# Patient Record
Sex: Male | Born: 1965 | Race: Black or African American | Hispanic: No | Marital: Married | Smoking: Current every day smoker
Health system: Southern US, Community
[De-identification: ages and names within clinical notes are randomized; demographics above are authoritative.]

## PROBLEM LIST (undated history)

## (undated) DIAGNOSIS — D869 Sarcoidosis, unspecified: Secondary | ICD-10-CM

## (undated) DIAGNOSIS — S39012A Strain of muscle, fascia and tendon of lower back, initial encounter: Secondary | ICD-10-CM

---

## 1998-02-01 ENCOUNTER — Encounter: Payer: Self-pay | Admitting: Emergency Medicine

## 1998-02-01 ENCOUNTER — Emergency Department (HOSPITAL_COMMUNITY): Admission: EM | Admit: 1998-02-01 | Discharge: 1998-02-01 | Payer: Self-pay | Admitting: *Deleted

## 1998-08-06 ENCOUNTER — Emergency Department (HOSPITAL_COMMUNITY): Admission: EM | Admit: 1998-08-06 | Discharge: 1998-08-06 | Payer: Self-pay | Admitting: Internal Medicine

## 1999-09-04 ENCOUNTER — Emergency Department (HOSPITAL_COMMUNITY): Admission: EM | Admit: 1999-09-04 | Discharge: 1999-09-04 | Payer: Self-pay | Admitting: Emergency Medicine

## 2006-10-24 ENCOUNTER — Emergency Department (HOSPITAL_COMMUNITY): Admission: EM | Admit: 2006-10-24 | Discharge: 2006-10-24 | Payer: Self-pay | Admitting: Emergency Medicine

## 2009-01-25 ENCOUNTER — Emergency Department (HOSPITAL_COMMUNITY): Admission: EM | Admit: 2009-01-25 | Discharge: 2009-01-25 | Payer: Self-pay | Admitting: Emergency Medicine

## 2011-04-16 ENCOUNTER — Emergency Department (HOSPITAL_COMMUNITY)
Admission: EM | Admit: 2011-04-16 | Discharge: 2011-04-16 | Disposition: A | Discharge status: Home / Self Care | Payer: Federal, State, Local not specified - PPO | Source: Home / Self Care | Attending: Emergency Medicine | Admitting: Emergency Medicine

## 2011-04-16 ENCOUNTER — Encounter (HOSPITAL_COMMUNITY): Payer: Self-pay | Admitting: Emergency Medicine

## 2011-04-16 DIAGNOSIS — S335XXA Sprain of ligaments of lumbar spine, initial encounter: Secondary | ICD-10-CM

## 2011-04-16 DIAGNOSIS — S39012A Strain of muscle, fascia and tendon of lower back, initial encounter: Secondary | ICD-10-CM

## 2011-04-16 HISTORY — DX: Strain of muscle, fascia and tendon of lower back, initial encounter: S39.012A

## 2011-04-16 HISTORY — DX: Sarcoidosis, unspecified: D86.9

## 2011-04-16 MED ORDER — MELOXICAM 15 MG PO TABS: 15.0000 mg | ORAL_TABLET | Freq: Every day | ORAL | Status: AC

## 2011-04-16 MED ORDER — METHOCARBAMOL 500 MG PO TABS: 500.0000 mg | ORAL_TABLET | Freq: Four times a day (QID) | ORAL | Status: AC

## 2011-04-16 MED ORDER — HYDROCODONE-ACETAMINOPHEN 5-325 MG PO TABS: 2.0000 | ORAL_TABLET | ORAL | Status: AC | PRN

## 2011-04-16 NOTE — ED Provider Notes (Signed)
History     CSN: [id number]  Arrival date & time 04/16/11  1024   First MD Initiated Contact with Patient 04/16/11 1053      Chief Complaint  Patient presents with  . Back Pain    (Consider location/radiation/quality/duration/timing/severity/associated sxs/prior treatment) HPI Comments: Patient reports right, nonradiating, achy lower thoracic back pain after helping his cousin move 4 days ago. Took 4 Aleve with temporary improvement, last dose several days ago. States this feels identical to previous time that he strained his back. No history of cancer, osteoporosis, prolonged steroid use, IVDU, fevers, bowel/bladder incontinence, saddle anesthesia.  Patient is a 46 y.o. male presenting with back pain. The history is provided by the patient. No language interpreter was used.  Back Pain  This is a new problem. The current episode started more than 2 days ago. The pain is associated with lifting heavy objects. The pain is present in the lumbar spine. The quality of the pain is described as aching. The pain does not radiate. The symptoms are aggravated by bending, twisting and certain positions. The pain is worse during the day. Pertinent negatives include no chest pain, no fever, no numbness, no abdominal pain, no abdominal swelling, no bowel incontinence, no perianal numbness, no bladder incontinence, no dysuria, no pelvic pain, no leg pain, no paresthesias, no paresis, no tingling and no weakness. He has tried NSAIDs for the symptoms. The treatment provided mild relief.    Past Medical History  Diagnosis Date  . Sarcoidosis   . Back strain     History reviewed. No pertinent past surgical history.  History reviewed. No pertinent family history.  History  Substance Use Topics  . Smoking status: Current Everyday Smoker -- 1.0 packs/day    Types: Cigarettes  . Smokeless tobacco: Not on file  . Alcohol Use: Yes     Occasional      Review of Systems  Constitutional: Negative for  fever.  Cardiovascular: Negative for chest pain.  Gastrointestinal: Negative for abdominal pain and bowel incontinence.  Genitourinary: Negative for bladder incontinence, dysuria and pelvic pain.  Musculoskeletal: Positive for back pain.  Neurological: Negative for tingling, weakness, numbness and paresthesias.    Allergies  Review of patient's allergies indicates no known allergies.  Home Medications   Current Outpatient Rx  Name Route Sig Dispense Refill  . HYDROCODONE-ACETAMINOPHEN 5-325 MG PO TABS Oral Take 2 tablets by mouth every 4 (four) hours as needed for pain. 20 tablet 0  . MELOXICAM 15 MG PO TABS Oral Take 1 tablet (15 mg total) by mouth daily. 14 tablet 0  . METHOCARBAMOL 500 MG PO TABS Oral Take 1 tablet (500 mg total) by mouth 4 (four) times daily. 40 tablet 0    BP 114/69  Pulse 78  Temp(Src) 98.8 F (37.1 C) (Oral)  Resp 16  SpO2 97%  Physical Exam  Nursing note and vitals reviewed. Constitutional: He is oriented to person, place, and time. He appears well-developed and well-nourished.  HENT:  Head: Normocephalic and atraumatic.  Eyes: Conjunctivae and EOM are normal.  Neck: Normal range of motion.  Cardiovascular: Normal rate.   Pulmonary/Chest: Effort normal. No respiratory distress.  Abdominal: He exhibits no distension.  Musculoskeletal: Normal range of motion.       Lumbar back: He exhibits no bony tenderness.       Back:       Bilateral lower extremities nontender, baseline ROM with intact  PT pulses. No pain with PROM hips bilaterally. SLR neg bilaterally.  Sensation baseline light touch bilaterally for Pt, DTR's symmetric and intact bilaterally KJ, Motor symmetric bilateral 5/5 hip flexion, quadriceps, hamstrings, EHL, foot dorsiflexion, foot plantarflexion, gait normal   Neurological: He is alert and oriented to person, place, and time.  Skin: Skin is warm and dry.  Psychiatric: He has a normal mood and affect. His behavior is normal.    ED  Course  Procedures (including critical care time)  Labs Reviewed - No data to display No results found.   1. Lumbar strain       MDM  H&P most consistent with right lumbar strain. No red flags on history or exam. Imaging not indicated at this time. Will send home with NSAIDs, muscle relaxants, Norco, gentle stretching exercises. Will refer patient to her primary care physician for routine health care.  Luiz Blare, MD 04/16/11 [phone]

## 2011-04-16 NOTE — ED Notes (Signed)
Pt was helping his cousin move on Saturday and felt a twinge in his back. When he has movement he has pain of 5-6 but does not have pain at all times. Pain has gotten slightly progressively worse but is not unbearable.

## 2011-04-16 NOTE — Discharge Instructions (Signed)
Take the medication as written. Take 1 gram of tylenol with the motrin up to 4 times a day as needed for pain and fever. This is an effective combination for pain. Take the hydrocodone/norco only for severe pain. Do not take the tylenol and hydrocodone/norco as they both have tylenol in them and too much can hurt your liver. Return if you get worse, have a  fever >100.4, or for any concerns.   Back Pain, Adult Low back pain is very common. About 1 in 5 people have back pain.The cause of low back pain is rarely dangerous. The pain often gets better over time.About half of people with a sudden onset of back pain feel better in just 2 weeks. About 8 in 10 people feel better by 6 weeks.  CAUSES Some common causes of back pain include:  Strain of the muscles or ligaments supporting the spine.   Wear and tear (degeneration) of the spinal discs.   Arthritis.   Direct injury to the back.  DIAGNOSIS Most of the time, the direct cause of low back pain is not known.However, back pain can be treated effectively even when the exact cause of the pain is unknown.Answering your caregiver's questions about your overall health and symptoms is one of the most accurate ways to make sure the cause of your pain is not dangerous. If your caregiver needs more information, he or she may order lab work or imaging tests (X-rays or MRIs).However, even if imaging tests show changes in your back, this usually does not require surgery. HOME CARE INSTRUCTIONS For many people, back pain returns.Since low back pain is rarely dangerous, it is often a condition that people can learn to [facility] their own.   Remain active. It is stressful on the back to sit or stand in one place. Do not sit, drive, or stand in one place for more than 30 minutes at a time. Take short walks on level surfaces as soon as pain allows.Try to increase the length of time you walk each day.   Do not stay in bed.Resting more than 1 or 2 days can  delay your recovery.   Do not avoid exercise or work.Your body is made to move.It is not dangerous to be active, even though your back may hurt.Your back will likely heal faster if you return to being active before your pain is gone.   Pay attention to your body when you bend and lift. Many people have less discomfortwhen lifting if they bend their knees, keep the load close to their bodies,and avoid twisting. Often, the most comfortable positions are those that put less stress on your recovering back.   Find a comfortable position to sleep. Use a firm mattress and lie on your side with your knees slightly bent. If you lie on your back, put a pillow under your knees.   Only take over-the-counter or prescription medicines as directed by your caregiver. Over-the-counter medicines to reduce pain and inflammation are often the most helpful.Your caregiver may prescribe muscle relaxant drugs.These medicines help dull your pain so you can more quickly return to your normal activities and healthy exercise.   Put ice on the injured area.   Put ice in a plastic bag.   Place a towel between your skin and the bag.   Leave the ice on for 15 to 20 minutes, 3 to 4 times a day for the first 2 to 3 days. After that, ice and heat may be alternated to reduce pain  and spasms.   Ask your caregiver about trying back exercises and gentle massage. This may be of some benefit.   Avoid feeling anxious or stressed.Stress increases muscle tension and can worsen back pain.It is important to recognize when you are anxious or stressed and learn ways to manage it.Exercise is a great option.  SEEK MEDICAL CARE IF:  You have pain that is not relieved with rest or medicine.   You have pain that does not improve in 1 week.   You have new symptoms.   You are generally not feeling well.  SEEK IMMEDIATE MEDICAL CARE IF:   You have pain that radiates from your back into your legs.   You develop new bowel or  bladder control problems.   You have unusual weakness or numbness in your arms or legs.   You develop nausea or vomiting.   You develop abdominal pain.   You feel faint.  Document Released: 02/17/2005 Document Revised: 10/30/2010 Document Reviewed: 07/08/2010 [facility] Patient Information 2012 [place], [place].

## 2012-03-17 ENCOUNTER — Emergency Department (HOSPITAL_COMMUNITY)
Admission: EM | Admit: 2012-03-17 | Discharge: 2012-03-17 | Disposition: A | Discharge status: Home / Self Care | Payer: Federal, State, Local not specified - PPO | Source: Home / Self Care

## 2012-03-17 ENCOUNTER — Encounter (HOSPITAL_COMMUNITY): Payer: Self-pay

## 2012-03-17 DIAGNOSIS — M7550 Bursitis of unspecified shoulder: Secondary | ICD-10-CM

## 2012-03-17 DIAGNOSIS — M67919 Unspecified disorder of synovium and tendon, unspecified shoulder: Secondary | ICD-10-CM

## 2012-03-17 DIAGNOSIS — M719 Bursopathy, unspecified: Secondary | ICD-10-CM

## 2012-03-17 MED ORDER — NAPROXEN 500 MG PO TBEC: 500.0000 mg | DELAYED_RELEASE_TABLET | Freq: Two times a day (BID) | ORAL | Status: AC

## 2012-03-17 MED ORDER — TRIAMCINOLONE ACETONIDE 40 MG/ML IJ SUSP
INTRAMUSCULAR | Status: AC
  Filled 2012-03-17: qty 5

## 2012-03-17 NOTE — ED Provider Notes (Signed)
History     CSN: [id number]  Arrival date & time 03/17/12  1230   None     Chief Complaint  Patient presents with  . 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 He states that in the past 3 days he said increased pain in his right shoulder. He points to the right deltoid into the right anterior shoulder. It is worse with certain movements and aches during the night. He denies any known trauma or repetitive injury.   Past Medical History  Diagnosis Date  . Sarcoidosis   . Back strain     History reviewed. No pertinent past surgical history.  History reviewed. No pertinent family history.  History  Substance Use Topics  . Smoking status: Current Every Day Smoker -- 1.0 packs/day    Types: Cigarettes  . Smokeless tobacco: Not on file  . Alcohol Use: Yes     Comment: Occasional      Review of Systems  All other systems reviewed and are negative.    Allergies  Review of patient's allergies indicates no known allergies.  Home Medications   Current Outpatient Rx  Name  Route  Sig  Dispense  Refill  . MELOXICAM 15 MG PO TABS   Oral   Take 1 tablet (15 mg total) by mouth daily.   14 tablet   0   . NAPROXEN 500 MG PO TBEC   Oral   Take 1 tablet (500 mg total) by mouth 2 (two) times daily with a meal.   20 tablet   0     BP 120/74  Pulse 75  Temp 98.1 F (36.7 C) (Oral)  Resp 16  SpO2 100%  Physical Exam  Constitutional: He is oriented to person, place, and time. He appears well-developed and well-nourished.  HENT:  Head: Normocephalic and atraumatic.  Eyes: EOM are normal. Left eye exhibits no discharge.  Neck: Normal range of motion. Neck supple.  Pulmonary/Chest: Effort normal.  Musculoskeletal: He exhibits tenderness. He exhibits  no edema.       Range of motion of the right shoulder is complete but associated with pain at extremes. He is able to abduct his arm or 150. No overlying swelling, edema or deformity. No bony tenderness. He points to the mid deltoid and anterior shoulder joint line at source of pain. Palpation reveals tenderness in both of these areas. Distal neurovascular motor sensory is intact.  Neurological: He is alert and oriented to person, place, and time. No cranial nerve deficit.  Skin: Skin is warm and dry.  Psychiatric: He has a normal mood and affect.    ED Course  Injection tendon or ligament Date/Time: 03/17/2012 2:59 PM Performed by: Phineas Real, Katalin Colledge Authorized by: Phineas Real, Lashina Milles Consent: Verbal consent obtained. Risks and benefits: risks, benefits and alternatives were discussed Consent given by: patient Patient understanding: patient states understanding of the procedure being performed Patient identity confirmed: verbally with patient Local anesthesia used: no Patient sedated: no Comments: 2 cc of Xylocaine 2% and 2 and half cc of Kenalog were injected in 2 separate sites within the right deltoid muscle and bursa.   (including critical care time)  Labs Reviewed - No data to display No results found.   1. Bursitis of deltoid   2. Tendinitis of right shoulder       MDM  Injection of 60  mg of Kenalog and 2 cc of Xylocaine 2% into the right deltoid bursa. Naprosyn EC 500 mg twice a day when necessary pain Apply heat to the area for comfort. Limit the use of your arm for the next several days. No heavy lifting or pulling. Oral your doctor if you continue to have shoulder pain. For any worsening new symptoms or problems may return         Hayden Rasmussen, NP 03/17/12 1503

## 2012-03-17 NOTE — ED Provider Notes (Signed)
Medical screening examination/treatment/procedure(s) were performed by non-physician practitioner and as supervising physician I was immediately available for consultation/collaboration.  Mordche Hedglin   Quinn Bartling, MD 03/17/12 1618 

## 2012-03-17 NOTE — ED Notes (Signed)
No known injury; state she ws told several yrs ago was told he had arthritis and bursitis in shoulder . Was okay last PM, but after work took shower, and has had pain in right shoulder ever since ; NAD

## 2012-08-03 ENCOUNTER — Emergency Department (HOSPITAL_COMMUNITY): Payer: Federal, State, Local not specified - PPO

## 2012-08-03 ENCOUNTER — Encounter (HOSPITAL_COMMUNITY): Payer: Self-pay | Admitting: Emergency Medicine

## 2012-08-03 ENCOUNTER — Emergency Department (HOSPITAL_COMMUNITY)
Admission: EM | Admit: 2012-08-03 | Discharge: 2012-08-03 | Disposition: A | Discharge status: Home / Self Care | Payer: Federal, State, Local not specified - PPO | Attending: Emergency Medicine | Admitting: Emergency Medicine

## 2012-08-03 DIAGNOSIS — M25522 Pain in left elbow: Secondary | ICD-10-CM

## 2012-08-03 DIAGNOSIS — Y9389 Activity, other specified: Secondary | ICD-10-CM | POA: Insufficient documentation

## 2012-08-03 DIAGNOSIS — F17200 Nicotine dependence, unspecified, uncomplicated: Secondary | ICD-10-CM | POA: Insufficient documentation

## 2012-08-03 DIAGNOSIS — S59909A Unspecified injury of unspecified elbow, initial encounter: Secondary | ICD-10-CM | POA: Insufficient documentation

## 2012-08-03 DIAGNOSIS — Y92410 Unspecified street and highway as the place of occurrence of the external cause: Secondary | ICD-10-CM | POA: Insufficient documentation

## 2012-08-03 DIAGNOSIS — M25512 Pain in left shoulder: Secondary | ICD-10-CM

## 2012-08-03 DIAGNOSIS — Z8739 Personal history of other diseases of the musculoskeletal system and connective tissue: Secondary | ICD-10-CM | POA: Insufficient documentation

## 2012-08-03 DIAGNOSIS — S6990XA Unspecified injury of unspecified wrist, hand and finger(s), initial encounter: Secondary | ICD-10-CM | POA: Insufficient documentation

## 2012-08-03 DIAGNOSIS — IMO0002 Reserved for concepts with insufficient information to code with codable children: Secondary | ICD-10-CM | POA: Insufficient documentation

## 2012-08-03 DIAGNOSIS — S46909A Unspecified injury of unspecified muscle, fascia and tendon at shoulder and upper arm level, unspecified arm, initial encounter: Secondary | ICD-10-CM | POA: Insufficient documentation

## 2012-08-03 DIAGNOSIS — S4980XA Other specified injuries of shoulder and upper arm, unspecified arm, initial encounter: Secondary | ICD-10-CM | POA: Insufficient documentation

## 2012-08-03 NOTE — ED Provider Notes (Signed)
Medical screening examination/treatment/procedure(s) were performed by non-physician practitioner and as supervising physician I was immediately available for consultation/collaboration.   Colvin Blatt J. Macdonald Rigor, MD 08/03/12 1432 
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
## 2014-08-06 ENCOUNTER — Emergency Department (INDEPENDENT_AMBULATORY_CARE_PROVIDER_SITE_OTHER)
Admission: EM | Admit: 2014-08-06 | Discharge: 2014-08-06 | Disposition: A | Discharge status: Home / Self Care | Payer: Federal, State, Local not specified - PPO | Source: Home / Self Care | Attending: Family Medicine | Admitting: Family Medicine

## 2014-08-06 ENCOUNTER — Encounter (HOSPITAL_COMMUNITY): Payer: Self-pay | Admitting: Emergency Medicine

## 2014-08-06 DIAGNOSIS — J40 Bronchitis, not specified as acute or chronic: Secondary | ICD-10-CM | POA: Diagnosis not present

## 2014-08-06 MED ORDER — PREDNISONE 10 MG PO TABS: 30.0000 mg | ORAL_TABLET | Freq: Every day | ORAL | Status: DC

## 2014-08-06 MED ORDER — IPRATROPIUM BROMIDE 0.06 % NA SOLN: 2.0000 | Freq: Four times a day (QID) | NASAL | Status: AC

## 2014-08-06 MED ORDER — PROMETHAZINE-CODEINE 6.25-10 MG/5ML PO SYRP: 5.0000 mL | ORAL_SOLUTION | Freq: Four times a day (QID) | ORAL | Status: AC | PRN

## 2014-08-06 NOTE — ED Notes (Signed)
C/o prod cough onset 3-4 days Sx include ST, prod cough, fatigue, congestion, sneezing Denies fevers, chills Alert, no signs of acute distress.

## 2014-08-06 NOTE — Discharge Instructions (Signed)
Thank you for coming in today. Call or go to the emergency room if you get worse, have trouble breathing, have chest pains, or palpitations.  Do not drive after taking the cough medicine.   Acute Bronchitis Bronchitis is inflammation of the airways that extend from the windpipe into the lungs (bronchi). The inflammation often causes mucus to develop. This leads to a cough, which is the most common symptom of bronchitis.  In acute bronchitis, the condition usually develops suddenly and goes away over time, usually in a couple weeks. Smoking, allergies, and asthma can make bronchitis worse. Repeated episodes of bronchitis may cause further lung problems.  CAUSES Acute bronchitis is most often caused by the same virus that causes a cold. The virus can spread from person to person (contagious) through coughing, sneezing, and touching contaminated objects. SIGNS AND SYMPTOMS   Cough.   Fever.   Coughing up mucus.   Body aches.   Chest congestion.   Chills.   Shortness of breath.   Sore throat.  DIAGNOSIS  Acute bronchitis is usually diagnosed through a physical exam. Your health care provider will also ask you questions about your medical history. Tests, such as chest X-rays, are sometimes done to rule out other conditions.  TREATMENT  Acute bronchitis usually goes away in a couple weeks. Oftentimes, no medical treatment is necessary. Medicines are sometimes given for relief of fever or cough. Antibiotic medicines are usually not needed but may be prescribed in certain situations. In some cases, an inhaler may be recommended to help reduce shortness of breath and control the cough. A cool mist vaporizer may also be used to help thin bronchial secretions and make it easier to clear the chest.  HOME CARE INSTRUCTIONS  Get plenty of rest.   Drink enough fluids to keep your urine clear or pale yellow (unless you have a medical condition that requires fluid restriction). Increasing  fluids may help thin your respiratory secretions (sputum) and reduce chest congestion, and it will prevent dehydration.   Take medicines only as directed by your health care provider.  If you were prescribed an antibiotic medicine, finish it all even if you start to feel better.  Avoid smoking and secondhand smoke. Exposure to cigarette smoke or irritating chemicals will make bronchitis worse. If you are a smoker, consider using nicotine gum or skin patches to help control withdrawal symptoms. Quitting smoking will help your lungs heal faster.   Reduce the chances of another bout of acute bronchitis by washing your hands frequently, avoiding people with cold symptoms, and trying not to touch your hands to your mouth, nose, or eyes.   Keep all follow-up visits as directed by your health care provider.  SEEK MEDICAL CARE IF: Your symptoms do not improve after 1 week of treatment.  SEEK IMMEDIATE MEDICAL CARE IF:  You develop an increased fever or chills.   You have chest pain.   You have severe shortness of breath.  You have bloody sputum.   You develop dehydration.  You faint or repeatedly feel like you are going to pass out.  You develop repeated vomiting.  You develop a severe headache. MAKE SURE YOU:   Understand these instructions.  Will watch your condition.  Will get help right away if you are not doing well or get worse. Document Released: 03/27/2004 Document Revised: 07/04/2013 Document Reviewed: 08/10/2012 [facility] Patient Information 2015 [place], [place]. This information is not intended to replace advice given to you by your health care provider. Make  sure you discuss any questions you have with your health care provider.

## 2014-08-06 NOTE — ED Provider Notes (Signed)
Eduardo Coffey is a 49 y.o. male who presents to Urgent Care today for cough. Patient has a productive cough present for the past 3 or 4 days. He notes worsening fatigue and nasal congestion. No fevers or chills shortness of breath chest tightness or wheezing. No chest pains or palpitations. The cough is productive and interferes with sleep. He is a daily smoker.   Past Medical History  Diagnosis Date  . Sarcoidosis   . Back strain    History reviewed. No pertinent past surgical history. History  Substance Use Topics  . Smoking status: Current Every Day Smoker -- 1.00 packs/day    Types: Cigarettes  . Smokeless tobacco: Not on file  . Alcohol Use: Yes     Comment: Occasional   ROS as above Medications: No current facility-administered medications for this encounter.   Current Outpatient Prescriptions  Medication Sig Dispense Refill  . ipratropium (ATROVENT) 0.06 % nasal spray Place 2 sprays into both nostrils 4 (four) times daily. 15 mL 1  . naproxen (EC-NAPROSYN) 500 MG EC tablet Take 1 tablet (500 mg total) by mouth 2 (two) times daily with a meal. 20 tablet 0  . predniSONE (DELTASONE) 10 MG tablet Take 3 tablets (30 mg total) by mouth daily. 15 tablet 0  . promethazine-codeine (PHENERGAN WITH CODEINE) 6.25-10 MG/5ML syrup Take 5 mLs by mouth every 6 (six) hours as needed for cough. 120 mL 0   No Known Allergies   Exam:  BP 122/80 mmHg  Pulse 72  Temp(Src) 98.2 F (36.8 C) (Oral)  Resp 18  SpO2 99% Gen: Well NAD HEENT: EOMI,  MMM posterior pharynx with cobblestoning. Normal tympanic membranes bilaterally Lungs: Normal work of breathing. Coarse breath sounds bilaterally Heart: RRR no MRG Abd: NABS, Soft. Nondistended, Nontender Exts: Brisk capillary refill, warm and well perfused.   No results found for this or any previous visit (from the past 24 hour(s)). No results found.  Assessment and Plan: 49 y.o. male with bronchitis. Treat with prednisone, Atrovent nasal spray,  and codeine cough syrup. Follow-up as needed.  Discussed warning signs or symptoms. Please see discharge instructions. Patient expresses understanding.     Rodolph BongEvan S Giavana Rooke, MD 08/06/14 1435

## 2016-04-17 ENCOUNTER — Encounter (HOSPITAL_COMMUNITY): Payer: Self-pay | Admitting: Emergency Medicine

## 2016-04-17 ENCOUNTER — Emergency Department (HOSPITAL_COMMUNITY)
Admission: EM | Admit: 2016-04-17 | Discharge: 2016-04-17 | Disposition: A | Discharge status: Home / Self Care | Payer: Federal, State, Local not specified - PPO | Attending: Emergency Medicine | Admitting: Emergency Medicine

## 2016-04-17 DIAGNOSIS — Z79899 Other long term (current) drug therapy: Secondary | ICD-10-CM | POA: Insufficient documentation

## 2016-04-17 DIAGNOSIS — R05 Cough: Secondary | ICD-10-CM | POA: Diagnosis not present

## 2016-04-17 DIAGNOSIS — F17210 Nicotine dependence, cigarettes, uncomplicated: Secondary | ICD-10-CM | POA: Insufficient documentation

## 2016-04-17 DIAGNOSIS — R6889 Other general symptoms and signs: Secondary | ICD-10-CM

## 2016-04-17 MED ORDER — BENZONATATE 100 MG PO CAPS: 200.0000 mg | ORAL_CAPSULE | Freq: Two times a day (BID) | ORAL | 0 refills | Status: DC | PRN

## 2016-04-17 MED ORDER — OXYMETAZOLINE HCL 0.05 % NA SOLN: 1.0000 | Freq: Two times a day (BID) | NASAL | 0 refills | Status: AC

## 2016-04-17 NOTE — Discharge Instructions (Signed)
Take your medications as prescribed. I also recommend taking Tylenol and ibuprofen as prescribed over-the-counter, alternating between doses every 3-4 hours. Continue drinking fluids at home to remain hydrated. I recommend eating a bland diet for the next few days and taper symptoms have improved. °Follow-up with your primary care provider in the next 3-4 days if symptoms have not improved. °Return to the emergency department if symptoms worsen or new onset of headache, neck stiffness, difficulty breathing, coughing up blood, chest pain, abdominal pain, vomiting, unable to keep fluids down.  °

## 2016-04-17 NOTE — ED Triage Notes (Signed)
Pt from home with hot and cold chills, decreased appetite, cough, and body aches since Monday. Pt denies N/V/D.  NAD, A&O.

## 2016-04-17 NOTE — ED Provider Notes (Signed)
[facility]EMERGENCY DEPT Provider Note   CSN: [id number] Arrival date & time: 04/17/16  [id number]  By signing my name below, I, Eduardo Coffey, attest that this documentation has been prepared under the direction and in the presence of Eduardo HakeNicole Coffey, [place] . Electronically Signed: Sonum Coffey, Neurosurgeoncribe. 04/17/16. 10:45 AM.  History   Chief Complaint Chief Complaint  Patient presents with  . Flu Like Symptoms    The history is provided by the patient. No language interpreter was used.    HPI Comments: Eduardo Coffey is a 51 y.o. male who presents to the Emergency Department complaining of a persistent, gradually worsened cough that began 4 days ago. He reports associated chills, generalized myalgias, rhinorrhea, decreased appetite, and sore throat. He notes chest soreness and back soreness that is present with coughing spells. He has tried Robitussin and Delsym with minimal relief. He denies fever, HA, SOB, wheezing, hemoptysis, CP, abdominal pain, nausea, vomiting, diarrhea, rash, urinary symptoms, rash. Denies any known sick contacts.  Past Medical History:  Diagnosis Date  . Back strain   . Sarcoidosis (HCC)     There are no active problems to display for this patient.   History reviewed. No pertinent surgical history.     Home Medications    Prior to Admission medications   Medication Sig Start Date End Date Taking? Authorizing Provider  benzonatate (TESSALON) 100 MG capsule Take 2 capsules (200 mg total) by mouth 2 (two) times daily as needed for cough. 04/17/16   Barrett HenleNicole Elizabeth Nadeau, PA-C  ipratropium (ATROVENT) 0.06 % nasal spray Place 2 sprays into both nostrils 4 (four) times daily. 08/06/14   Rodolph BongEvan S Corey, MD  naproxen (EC-NAPROSYN) 500 MG EC tablet Take 1 tablet (500 mg total) by mouth 2 (two) times daily with a meal. 03/17/12   Hayden Rasmussenavid Mabe, NP  oxymetazoline (AFRIN NASAL SPRAY) 0.05 % nasal spray Place 1 spray into both nostrils 2 (two) times daily. Spray once into each nostril twice  daily for up to the next 3 days. Do not use for more than 3 days to prevent rebound rhinorrhea. 04/17/16   Barrett HenleNicole Elizabeth Nadeau, PA-C  predniSONE (DELTASONE) 10 MG tablet Take 3 tablets (30 mg total) by mouth daily. 08/06/14   Rodolph BongEvan S Corey, MD  promethazine-codeine (PHENERGAN WITH CODEINE) 6.25-10 MG/5ML syrup Take 5 mLs by mouth every 6 (six) hours as needed for cough. 08/06/14   Rodolph BongEvan S Corey, MD    Family History History reviewed. No pertinent family history.  Social History Social History  Substance Use Topics  . Smoking status: Current Every Day Smoker    Packs/day: 1.00    Types: Cigarettes  . Smokeless tobacco: Never Used  . Alcohol use 14.4 oz/week    24 Cans of beer per week     Allergies   Patient has no known allergies.   Review of Systems Review of Systems  Constitutional: Positive for appetite change and chills.  HENT: Positive for rhinorrhea and sore throat.   Respiratory: Positive for cough. Negative for shortness of breath and wheezing.   Cardiovascular: Negative for chest pain.  Gastrointestinal: Negative for abdominal pain, diarrhea, nausea and vomiting.  Genitourinary: Negative for difficulty urinating, dysuria, frequency and hematuria.  Musculoskeletal: Positive for myalgias.  Skin: Negative for rash.     Physical Exam Updated Vital Signs BP 104/77 (BP Location: Right Arm)   Pulse 88   Temp 99.5 F (37.5 C) (Oral)   Resp 18   Ht 6' (1.829 m)  Wt 83.9 kg   SpO2 98%   BMI 25.09 kg/m   Physical Exam  Constitutional: He is oriented to person, place, and time. He appears well-developed and well-nourished.  HENT:  Head: Normocephalic and atraumatic.  Right Ear: Tympanic membrane normal.  Left Ear: Tympanic membrane normal.  Nose: Rhinorrhea present. Right sinus exhibits no maxillary sinus tenderness and no frontal sinus tenderness. Left sinus exhibits no maxillary sinus tenderness and no frontal sinus tenderness.  Mouth/Throat: Uvula is midline,  oropharynx is clear and moist and mucous membranes are normal. No oropharyngeal exudate, posterior oropharyngeal edema, posterior oropharyngeal erythema or tonsillar abscesses. No tonsillar exudate.  Eyes: Conjunctivae and EOM are normal. Pupils are equal, round, and reactive to light. Right eye exhibits no discharge. Left eye exhibits no discharge. No scleral icterus.  Neck: Normal range of motion. Neck supple.  Cardiovascular: Normal rate, regular rhythm, normal heart sounds and intact distal pulses.   Pulmonary/Chest: Effort normal and breath sounds normal. No respiratory distress. He has no wheezes. He has no rales. He exhibits no tenderness.  Abdominal: Soft. Bowel sounds are normal. He exhibits no distension and no mass. There is no tenderness. There is no rebound and no guarding. No hernia.  Musculoskeletal: Normal range of motion. He exhibits no edema.  Lymphadenopathy:    He has no cervical adenopathy.  Neurological: He is alert and oriented to person, place, and time.  Skin: Skin is warm and dry.  Psychiatric: He has a normal mood and affect.  Nursing note and vitals reviewed.    ED Treatments / Results  DIAGNOSTIC STUDIES: Oxygen Saturation is 98% on RA, normal by my interpretation.    COORDINATION OF CARE: 10:42 AM Discussed treatment plan with pt at bedside and pt agreed to plan.   Labs (all labs ordered are listed, but only abnormal results are displayed) Labs Reviewed - No data to display  EKG  EKG Interpretation None       Radiology No results found.  Procedures Procedures (including critical care time)  Medications Ordered in ED Medications - No data to display   Initial Impression / Assessment and Plan / ED Course  I have reviewed the triage vital signs and the nursing notes.  Pertinent labs & imaging results that were available during my care of the patient were reviewed by me and considered in my medical decision making (see chart for details).       Patient with symptoms consistent with influenza.  Vitals are stable, low-grade fever.  No signs of dehydration, tolerating PO's.  Lungs are clear. Due to patient's presentation and physical exam a chest x-ray was not ordered bc likely diagnosis of flu.  Discussed the cost versus benefit of Tamiflu treatment with the patient.  The patient understands that symptoms are greater than the recommended 24-48 hour window of treatment.  Patient will be discharged with instructions to orally hydrate, rest, and use over-the-counter medications such as anti-inflammatories ibuprofen and Aleve for muscle aches and Tylenol for fever.  Patient will also be given a cough suppressant and decongestant. Advised patient to follow up with PCP as needed. Discussed strict return precautions.    Final Clinical Impressions(s) / ED Diagnoses   Final diagnoses:  Flu-like symptoms    New Prescriptions New Prescriptions   BENZONATATE (TESSALON) 100 MG CAPSULE    Take 2 capsules (200 mg total) by mouth 2 (two) times daily as needed for cough.   OXYMETAZOLINE (AFRIN NASAL SPRAY) 0.05 % NASAL SPRAY  Place 1 spray into both nostrils 2 (two) times daily. Spray once into each nostril twice daily for up to the next 3 days. Do not use for more than 3 days to prevent rebound rhinorrhea.   I personally performed the services described in this documentation, which was scribed in my presence. The recorded information has been reviewed and is accurate.    Satira Sark [place], [place] 04/17/16 1057    Mancel Bale, MD 04/17/16 [phone]

## 2016-04-17 NOTE — ED Notes (Signed)
C/o dry cough onset Monday with scratchy throat, cough go a little worse on tues   c/o decreased appetite on Wed. Prod. Cough today white sputum

## 2016-05-03 ENCOUNTER — Emergency Department (HOSPITAL_COMMUNITY)
Admission: EM | Admit: 2016-05-03 | Discharge: 2016-05-03 | Disposition: A | Discharge status: Home / Self Care | Payer: Federal, State, Local not specified - PPO | Attending: Emergency Medicine | Admitting: Emergency Medicine

## 2016-05-03 ENCOUNTER — Encounter (HOSPITAL_COMMUNITY): Payer: Self-pay | Admitting: *Deleted

## 2016-05-03 ENCOUNTER — Emergency Department (HOSPITAL_COMMUNITY): Payer: Federal, State, Local not specified - PPO

## 2016-05-03 DIAGNOSIS — J069 Acute upper respiratory infection, unspecified: Secondary | ICD-10-CM | POA: Diagnosis not present

## 2016-05-03 DIAGNOSIS — F17210 Nicotine dependence, cigarettes, uncomplicated: Secondary | ICD-10-CM | POA: Diagnosis not present

## 2016-05-03 DIAGNOSIS — B9789 Other viral agents as the cause of diseases classified elsewhere: Secondary | ICD-10-CM

## 2016-05-03 DIAGNOSIS — R05 Cough: Secondary | ICD-10-CM | POA: Diagnosis present

## 2016-05-03 MED ORDER — BENZONATATE 100 MG PO CAPS: 100.0000 mg | ORAL_CAPSULE | Freq: Three times a day (TID) | ORAL | 0 refills | Status: AC | PRN

## 2016-05-03 MED ORDER — ACETAMINOPHEN 500 MG PO TABS: 500.0000 mg | ORAL_TABLET | Freq: Four times a day (QID) | ORAL | 0 refills | Status: AC | PRN

## 2016-05-03 MED ORDER — ACETAMINOPHEN 325 MG PO TABS
650.0000 mg | ORAL_TABLET | Freq: Once | ORAL | Status: AC
  Administered 2016-05-03: 650 mg via ORAL
  Filled 2016-05-03: qty 2

## 2016-05-03 MED ORDER — ALBUTEROL SULFATE HFA 108 (90 BASE) MCG/ACT IN AERS
2.0000 | INHALATION_SPRAY | Freq: Once | RESPIRATORY_TRACT | Status: AC
  Administered 2016-05-03: 2 via RESPIRATORY_TRACT
  Filled 2016-05-03: qty 6.7

## 2016-05-03 MED ORDER — FLUTICASONE PROPIONATE 50 MCG/ACT NA SUSP: 2.0000 | Freq: Every day | NASAL | 0 refills | Status: AC

## 2016-05-03 MED ORDER — CETIRIZINE HCL 10 MG PO TABS: 10.0000 mg | ORAL_TABLET | Freq: Every day | ORAL | 1 refills | Status: AC

## 2016-05-03 MED ORDER — AEROCHAMBER PLUS FLO-VU LARGE MISC
1.0000 | Freq: Once | Status: AC
  Administered 2016-05-03: 1

## 2016-05-03 NOTE — ED Provider Notes (Signed)
[facility]PT Provider Note   CSN: [id number] Arrival date & time: 05/03/16  0532     History   Chief Complaint Chief Complaint  Patient presents with  . Cough    HPI Eduardo Coffey is a 51 y.o. male.  Eduardo Coffey is a 51 y.o. Male who is a smoker who presents to the emergency room in complaining of worsening cough beginning yesterday. Patient reports 2 weeks ago he was diagnosed with an upper respiratory infection and had a cough. He reports his cough improved and then worsened again yesterday. He reports associated nasal congestion, postnasal drip, chest tightness and some wheezing. He is unsure about fevers. He denies body aches. He has taken nothing for treatment of his symptoms today. He denies fevers, body aches, shortness of breath, chest pain, abdominal pain, nausea, vomiting, rashes, sore throat or trouble swallowing.   The history is provided by the patient and medical records. No language interpreter was used.  Cough  Associated symptoms include rhinorrhea and wheezing. Pertinent negatives include no chest pain, no chills, no headaches, no sore throat, no myalgias and no shortness of breath.    Past Medical History:  Diagnosis Date  . Back strain   . Sarcoidosis (HCC)     There are no active problems to display for this patient.   History reviewed. No pertinent surgical history.     Home Medications    Prior to Admission medications   Medication Sig Start Date End Date Taking? Authorizing Provider  acetaminophen (TYLENOL) 500 MG tablet Take 1 tablet (500 mg total) by mouth every 6 (six) hours as needed. 05/03/16   Everlene Farrier, PA-C  benzonatate (TESSALON) 100 MG capsule Take 1 capsule (100 mg total) by mouth 3 (three) times daily as needed. 05/03/16   Everlene Farrier, PA-C  cetirizine (ZYRTEC ALLERGY) 10 MG tablet Take 1 tablet (10 mg total) by mouth daily. 05/03/16   Everlene Farrier, PA-C  fluticasone (FLONASE) 50 MCG/ACT nasal spray Place 2 sprays into both  nostrils daily. 05/03/16   Everlene Farrier, PA-C  ipratropium (ATROVENT) 0.06 % nasal spray Place 2 sprays into both nostrils 4 (four) times daily. 08/06/14   Rodolph Bong, MD  naproxen (EC-NAPROSYN) 500 MG EC tablet Take 1 tablet (500 mg total) by mouth 2 (two) times daily with a meal. 03/17/12   Hayden Rasmussen, NP  oxymetazoline (AFRIN NASAL SPRAY) 0.05 % nasal spray Place 1 spray into both nostrils 2 (two) times daily. Spray once into each nostril twice daily for up to the next 3 days. Do not use for more than 3 days to prevent rebound rhinorrhea. 04/17/16   Barrett Henle, PA-C  predniSONE (DELTASONE) 10 MG tablet Take 3 tablets (30 mg total) by mouth daily. 08/06/14   Rodolph Bong, MD  promethazine-codeine (PHENERGAN WITH CODEINE) 6.25-10 MG/5ML syrup Take 5 mLs by mouth every 6 (six) hours as needed for cough. 08/06/14   Rodolph Bong, MD    Family History No family history on file.  Social History Social History  Substance Use Topics  . Smoking status: Current Every Day Smoker    Packs/day: 1.00    Types: Cigarettes  . Smokeless tobacco: Never Used  . Alcohol use 14.4 oz/week    24 Cans of beer per week     Allergies   Patient has no known allergies.   Review of Systems Review of Systems  Constitutional: Negative for chills and fever.  HENT: Positive for congestion, postnasal drip,  rhinorrhea and sneezing. Negative for sore throat and trouble swallowing.   Eyes: Negative for visual disturbance.  Respiratory: Positive for cough, chest tightness and wheezing. Negative for shortness of breath.   Cardiovascular: Negative for chest pain and palpitations.  Gastrointestinal: Negative for abdominal pain, nausea and vomiting.  Genitourinary: Negative for dysuria.  Musculoskeletal: Negative for myalgias and neck pain.  Skin: Negative for rash.  Neurological: Negative for light-headedness and headaches.     Physical Exam Updated Vital Signs BP 118/82 (BP Location: Left Arm)   Pulse  79   Temp 100 F (37.8 C) (Oral)   Resp 19   SpO2 100%   Physical Exam  Constitutional: He appears well-developed and well-nourished. No distress.  Nontoxic appearing.  HENT:  Head: Normocephalic and atraumatic.  Right Ear: External ear normal.  Left Ear: External ear normal.  Mouth/Throat: Oropharynx is clear and moist.  Boggy nasal turbinates and rhinorrhea present. Throat is clear.  Eyes: Conjunctivae are normal. Pupils are equal, round, and reactive to light. Right eye exhibits no discharge. Left eye exhibits no discharge.  Neck: Normal range of motion. Neck supple. No JVD present.  Cardiovascular: Normal rate, regular rhythm, normal heart sounds and intact distal pulses.  Exam reveals no gallop and no friction rub.   No murmur heard. Pulmonary/Chest: Effort normal and breath sounds normal. No stridor. No respiratory distress. He has no wheezes. He has no rales.  Lungs clear to auscultation bilaterally. No increased work of breathing. No rales or rhonchi.  Abdominal: Soft. There is no tenderness. There is no guarding.  Musculoskeletal: He exhibits no edema.  Lymphadenopathy:    He has no cervical adenopathy.  Neurological: He is alert. Coordination normal.  Skin: Skin is warm and dry. No rash noted. He is not diaphoretic. No erythema. No pallor.  Psychiatric: He has a normal mood and affect. His behavior is normal.  Nursing note and vitals reviewed.    ED Treatments / Results  Labs (all labs ordered are listed, but only abnormal results are displayed) Labs Reviewed - No data to display  EKG  EKG Interpretation None       Radiology Dg Chest 2 View  Result Date: 05/03/2016 CLINICAL DATA:  Productive cough, chest pain. EXAM: CHEST  2 VIEW COMPARISON:  Radiographs of January 25, 2009. FINDINGS: The heart size and mediastinal contours are within normal limits. No pneumothorax or pleural effusion is noted. Stable bibasilar densities are noted consistent with scarring. No  definite acute pulmonary disease is noted. The visualized skeletal structures are unremarkable. IMPRESSION: Stable bibasilar scarring. No acute cardiopulmonary abnormality seen. Electronically Signed   By: Lupita RaiderJames  Green Jr, M.D.   On: 05/03/2016 07:33    Procedures Procedures (including critical care time)  Medications Ordered in ED Medications  albuterol (PROVENTIL HFA;VENTOLIN HFA) 108 (90 Base) MCG/ACT inhaler 2 puff (not administered)  AEROCHAMBER PLUS FLO-VU LARGE MISC 1 each (not administered)  acetaminophen (TYLENOL) tablet 650 mg (not administered)     Initial Impression / Assessment and Plan / ED Course  I have reviewed the triage vital signs and the nursing notes.  Pertinent labs & imaging results that were available during my care of the patient were reviewed by me and considered in my medical decision making (see chart for details).    This is a 51 y.o. Male who is a smoker who presents to the emergency room in complaining of worsening cough beginning yesterday. Patient reports 2 weeks ago he was diagnosed with an upper respiratory  infection and had a cough. He reports his cough improved and then worsened again yesterday. He reports associated nasal congestion, postnasal drip, chest tightness and some wheezing. He is unsure about fevers. He denies body aches. On arrival to the emergency department the patient is a temperature of 100.0. On exam he is nontoxic appearing. His lungs are clear to auscultation bilaterally. No tachypnea or hypoxia. No wheezing noted.  He has rhinorrhea and boggy nasal turbinates bilaterally. Throat is clear. Chest x-ray was obtained which showed stable bibasilar scarring. No acute cardiopulmonary abnormality seen. No evidence of pneumonia. Patient does report subjective wheezing and chest tightness intermittently. Provided him with an albuterol inhaler with spacer. I discussed he could use this every 6 hours as needed for wheezing or chest tightness. I  note no wheezing on my exam. I discussed smoking cessation for greater than 5 minutes with the patient. Patient with viral upper respiratory infection. Will start the patient on Flonase, Zyrtec, Tessalon Perles and Tylenol. I discussed strict and specific return precautions. I advised the patient to follow-up with their primary care provider this week. I advised the patient to return to the emergency department with new or worsening symptoms or new concerns. The patient verbalized understanding and agreement with plan.      Final Clinical Impressions(s) / ED Diagnoses   Final diagnoses:  Viral URI with cough    New Prescriptions New Prescriptions   ACETAMINOPHEN (TYLENOL) 500 MG TABLET    Take 1 tablet (500 mg total) by mouth every 6 (six) hours as needed.   BENZONATATE (TESSALON) 100 MG CAPSULE    Take 1 capsule (100 mg total) by mouth 3 (three) times daily as needed.   CETIRIZINE (ZYRTEC ALLERGY) 10 MG TABLET    Take 1 tablet (10 mg total) by mouth daily.   FLUTICASONE (FLONASE) 50 MCG/ACT NASAL SPRAY    Place 2 sprays into both nostrils daily.     Everlene Farrier, PA-C 05/03/16 1610    Dione Booze, MD 05/03/16 [phone]

## 2016-05-03 NOTE — ED Triage Notes (Signed)
The pt has had a cough and cold for 2 weeks he was seen then and diagnosed with bronchitis.  He has a productive cough and he is a  Smoker  Chest congestion.  Low grade temp

## 2016-05-03 NOTE — ED Notes (Signed)
EDP at bedside  

## 2017-09-28 ENCOUNTER — Emergency Department (HOSPITAL_COMMUNITY): Payer: Federal, State, Local not specified - PPO

## 2017-09-28 ENCOUNTER — Encounter (HOSPITAL_COMMUNITY): Payer: Self-pay | Admitting: Emergency Medicine

## 2017-09-28 ENCOUNTER — Other Ambulatory Visit: Payer: Self-pay

## 2017-09-28 ENCOUNTER — Emergency Department (HOSPITAL_COMMUNITY)
Admission: EM | Admit: 2017-09-28 | Discharge: 2017-09-28 | Disposition: A | Discharge status: Home / Self Care | Payer: Federal, State, Local not specified - PPO | Attending: Emergency Medicine | Admitting: Emergency Medicine

## 2017-09-28 DIAGNOSIS — R2232 Localized swelling, mass and lump, left upper limb: Secondary | ICD-10-CM | POA: Diagnosis present

## 2017-09-28 DIAGNOSIS — F17210 Nicotine dependence, cigarettes, uncomplicated: Secondary | ICD-10-CM | POA: Insufficient documentation

## 2017-09-28 DIAGNOSIS — Z79899 Other long term (current) drug therapy: Secondary | ICD-10-CM | POA: Diagnosis not present

## 2017-09-28 DIAGNOSIS — M1380 Other specified arthritis, unspecified site: Secondary | ICD-10-CM

## 2017-09-28 DIAGNOSIS — I00 Rheumatic fever without heart involvement: Secondary | ICD-10-CM | POA: Diagnosis not present

## 2017-09-28 MED ORDER — PREDNISONE 10 MG (21) PO TBPK: ORAL_TABLET | ORAL | 0 refills | Status: AC

## 2017-09-28 MED ORDER — PREDNISONE 20 MG PO TABS
60.0000 mg | ORAL_TABLET | Freq: Once | ORAL | Status: AC
  Administered 2017-09-28: 60 mg via ORAL
  Filled 2017-09-28: qty 3

## 2017-09-28 MED ORDER — ACETAMINOPHEN 500 MG PO TABS
1000.0000 mg | ORAL_TABLET | Freq: Once | ORAL | Status: AC
  Administered 2017-09-28: 1000 mg via ORAL
  Filled 2017-09-28: qty 2

## 2017-09-28 NOTE — Discharge Instructions (Addendum)
Thank you for allowing me to care for you today in the Emergency Department.   Please call the number on your discharge paperwork to get established with a primary care provider for follow-up.  You may need additional blood work performed by primary care.  You were given your first dose of prednisone today in the emergency department.  Starting tomorrow, take 6 tabs daily x2 days, then 5 tabs x2 days, then 4 tabs x2 days, then 3 tabs x2 days, 2 tabs x2 days, then 1 tab x2 days.  Make sure to take the entire course of this medication even if you start feeling better.  Take 600 mg of ibuprofen with food or 650 mg of Tylenol every 6 hours for pain.   Return to the emergency department if you develop a high fever, if the joints get red and hot to the touch, if you develop changes in your vision such as double vision, or burning repeat your blood in your urine, or other new, concerning symptoms.

## 2017-09-28 NOTE — ED Triage Notes (Signed)
Pt. Stated, Im having joint pain and today my left hand is hurting in the joints and its swollen.

## 2017-09-28 NOTE — ED Provider Notes (Signed)
[facility] EMERGENCY DEPARTMENT Provider Note   CSN: [id number] Arrival date & time: 09/28/17  1047     History   Chief Complaint Chief Complaint  Patient presents with  . Hand Pain    HPI Eduardo Coffey is a 52 y.o. male history of sarcoidosis who presents to the emergency department with a chief complaint of left hand swelling.  The patient reports that he awoke this morning with swelling over the first and second knuckles of the left hand.  He reports that he has had pain that seems to move throughout the joints in his bilateral hands, wrists, knees, and ankle for months.  He reports severe pain associated with the swelling that began this morning.  He states that earlier this week he felt like he had more pain in his right hand, but has noticed that it has moved from joint to joint over the last few months.  He states that sometimes he feels as if drops things because he is unable to grasp an object due to pain and stiffness in his joints.  He has been treating his symptoms with ibuprofen, last dose was 800 mg this morning.  He denies visual changes, rashes, urinary symptoms, nausea, vomiting, diarrhea, headache, weakness, numbness, fever, chills, or redness or swelling to the extremities.  He reports that he was diagnosed with sarcoidosis when he was in his 37s.  He presented with weight loss, chest pain, dyspnea, and persistent vomiting at that time.  He has not had no other flareups.  He does not have a PCP.  He does not take any medications on a daily basis.  The patient works 12-hour shifts at [employer].  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   Hand Pain  Pertinent negatives include no chest pain, no abdominal pain and no shortness of breath.    Past Medical History:  Diagnosis Date  . Back strain   . Sarcoidosis     There are no active problems to display for this  patient.   History reviewed. No pertinent surgical history.      Home Medications    Prior to Admission medications   Medication Sig Start Date End Date Taking? Authorizing Provider  acetaminophen (TYLENOL) 500 MG tablet Take 1 tablet (500 mg total) by mouth every 6 (six) hours as needed. 05/03/16   Everlene Farrier, PA-C  benzonatate (TESSALON) 100 MG capsule Take 1 capsule (100 mg total) by mouth 3 (three) times daily as needed. 05/03/16   Everlene Farrier, PA-C  cetirizine (ZYRTEC ALLERGY) 10 MG tablet Take 1 tablet (10 mg total) by mouth daily. 05/03/16   Everlene Farrier, PA-C  fluticasone (FLONASE) 50 MCG/ACT nasal spray Place 2 sprays into both nostrils daily. 05/03/16   Everlene Farrier, PA-C  ipratropium (ATROVENT) 0.06 % nasal spray Place 2 sprays into both nostrils 4 (four) times daily. 08/06/14   Rodolph Bong, MD  naproxen (EC-NAPROSYN) 500 MG EC tablet Take 1 tablet (500 mg total) by mouth 2 (two) times daily with a meal. 03/17/12   Hayden Rasmussen, NP  oxymetazoline (AFRIN NASAL SPRAY) 0.05 % nasal spray Place 1 spray into both nostrils 2 (two) times daily. Spray once into each nostril twice daily for up to the next 3 days. Do not use for more than 3 days to prevent rebound rhinorrhea. 04/17/16   Barrett Henle, PA-C  predniSONE (STERAPRED UNI-PAK 21  TAB) 10 MG (21) TBPK tablet Take 6 tabs daily x2 days, then 5 tabs x2 days, then 4 tabs x2 days, then 3 tabs x2 days, 2 tabs x2 days, then 1 tab x2 days 09/28/17   Desaray Marschner A, PA-C  promethazine-codeine (PHENERGAN WITH CODEINE) 6.25-10 MG/5ML syrup Take 5 mLs by mouth every 6 (six) hours as needed for cough. 08/06/14   Rodolph Bongorey, Evan S, MD    Family History No family history on file.  Social History Social History   Tobacco Use  . Smoking status: Current Every Day Smoker    Packs/day: 1.00    Types: Cigarettes  . Smokeless tobacco: Never Used  Substance Use Topics  . Alcohol use: Yes    Alcohol/week: 14.4 oz    Types: 24 Cans of  beer per week  . Drug use: No     Allergies   Patient has no known allergies.   Review of Systems Review of Systems  Constitutional: Negative for activity change, chills and fever.  Eyes: Negative for visual disturbance.  Respiratory: Negative for shortness of breath.   Cardiovascular: Negative for chest pain.  Gastrointestinal: Negative for abdominal pain.  Musculoskeletal: Positive for arthralgias, joint swelling and myalgias. Negative for back pain, gait problem, neck pain and neck stiffness.  Skin: Negative for rash.  Neurological: Negative for weakness and numbness.   Physical Exam Updated Vital Signs BP (!) 122/95   Pulse 63   Temp 98.4 F (36.9 C) (Oral)   Resp 14   Ht 6' (1.829 m)   Wt 83.9 kg (185 lb)   SpO2 98%   BMI 25.09 kg/m   Physical Exam  Constitutional: He appears well-developed.  HENT:  Head: Normocephalic.  Eyes: Conjunctivae are normal.  Neck: Neck supple.  Cardiovascular: Normal rate, regular rhythm, normal heart sounds and intact distal pulses. Exam reveals no gallop and no friction rub.  No murmur heard. Pulmonary/Chest: Effort normal and breath sounds normal. No stridor. No respiratory distress. He has no wheezes. He has no rales. He exhibits no tenderness.  Abdominal: Soft. He exhibits no distension.  Musculoskeletal:  First and second MCP of the left hand are edematous without erythema or warmth.  Decreased range of motion of the first and second MCPs secondary to pain and swelling.  There is also edema to the second digit of the right hand.  He has also focally tender to palpation to the ventral surface of the mid left wrist.  5 out of 5 strength against resistance of the bilateral upper and lower extremities.  Radial pulses are 2+ and symmetric.  Sensation is intact and equal throughout.  Neurological: He is alert.  Skin: Skin is warm and dry.  No rash to the bilateral lower extremities.  Psychiatric: His behavior is normal.  Nursing note  and vitals reviewed.    ED Treatments / Results  Labs (all labs ordered are listed, but only abnormal results are displayed) Labs Reviewed - No data to display  EKG None  Radiology Dg Chest 2 View  Result Date: 09/28/2017 CLINICAL DATA:  Polyarthropathy.  Sarcoid EXAM: CHEST - 2 VIEW COMPARISON:  05/03/2016 FINDINGS: Pulmonary hyperinflation with linear scarring in the bases unchanged. Negative for acute infiltrate or effusion. No mass or adenopathy. Heart size normal. 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 No acute abnormality. Negative for adenopathy. Electronically Signed   By: Marlan Palauharles  Clark M.D.   On: 09/28/2017 13:07   Dg Wrist Complete Left  Result Date: 09/28/2017 CLINICAL  DATA:  Polyarthropathy.  Sarcoid. EXAM: LEFT WRIST - COMPLETE 3+ VIEW COMPARISON:  None. FINDINGS: There is no evidence of fracture or dislocation. There is no evidence of arthropathy or other focal bone abnormality. Soft tissues are unremarkable. IMPRESSION: Negative. Electronically Signed   By: Marlan Palau M.D.   On: 09/28/2017 13:06   Dg Hand Complete Left  Result Date: 09/28/2017 CLINICAL DATA:  Polyarthropathy. Swelling first and second MCP. History of sarcoid. EXAM: LEFT HAND - COMPLETE 3+ VIEW COMPARISON:  None. FINDINGS: There is no evidence of fracture or dislocation. There is no evidence of arthropathy or other focal bone abnormality. Soft tissues are unremarkable. IMPRESSION: Negative. Electronically Signed   By: Marlan Palau M.D.   On: 09/28/2017 13:05    Procedures Procedures (including critical care time)  Medications Ordered in ED Medications  predniSONE (DELTASONE) tablet 60 mg (has no administration in time range)  acetaminophen (TYLENOL) tablet 1,000 mg (1,000 mg Oral Given 09/28/17 1241)     Initial Impression / Assessment and Plan / ED Course  I have reviewed the triage vital signs and the nursing notes.  Pertinent labs & imaging results that were available during my  care of the patient were reviewed by me and considered in my medical decision making (see chart for details).     52 year old male with a history of sarcoidosis without a flare for the last 20 to 30 years presenting with migratory polyarthropathy in the bilateral hands, wrists, knees, and ankles for several months.  He came for evaluation today due to sudden onset edema of the first and second MCP of the left hand.  No erythema or warmth.  Doubt gout or septic joint.  X-rays are negative for joint destruction, fractures, or other acute changes.  X-ray of the chest is negative for sarcoidosis flare.  Patient has not established with a primary care provider.  We had a lengthy discussion regarding that he should follow-up regarding the migratory polyarthropathy he has been experiencing for the last few months.  Will start the patient on a prednisone taper for his current symptoms.  He reports significant improvement with his pain after Tylenol.  Will recommend anti-inflammatories for home use.  Strict return precautions given.  He is hemodynamically stable and in no acute distress.  Patient is safe for discharge home at this time.  Final Clinical Impressions(s) / ED Diagnoses   Final diagnoses:  Migratory polyarthritis    ED Discharge Orders        Ordered    predniSONE (STERAPRED UNI-PAK 21 TAB) 10 MG (21) TBPK tablet     09/28/17 1406       Devina Bezold A, PA-C 09/28/17 1411    Mancel Bale, MD 09/30/17 1143

## 2017-09-28 NOTE — ED Notes (Signed)
Patient verbalizes understanding of discharge instructions. Opportunity for questioning and answers were provided. Armband removed by staff, pt discharged from ED ambulatory.   

## 2019-03-12 IMAGING — DX DG WRIST COMPLETE 3+V*L*
4 series · 4 of 4 positions shown · non-contrast
Comparison: None.

CLINICAL DATA: Polyarthropathy.  Sarcoid.

EXAM:
LEFT WRIST - COMPLETE 3+ VIEW

[x wrist pa left]
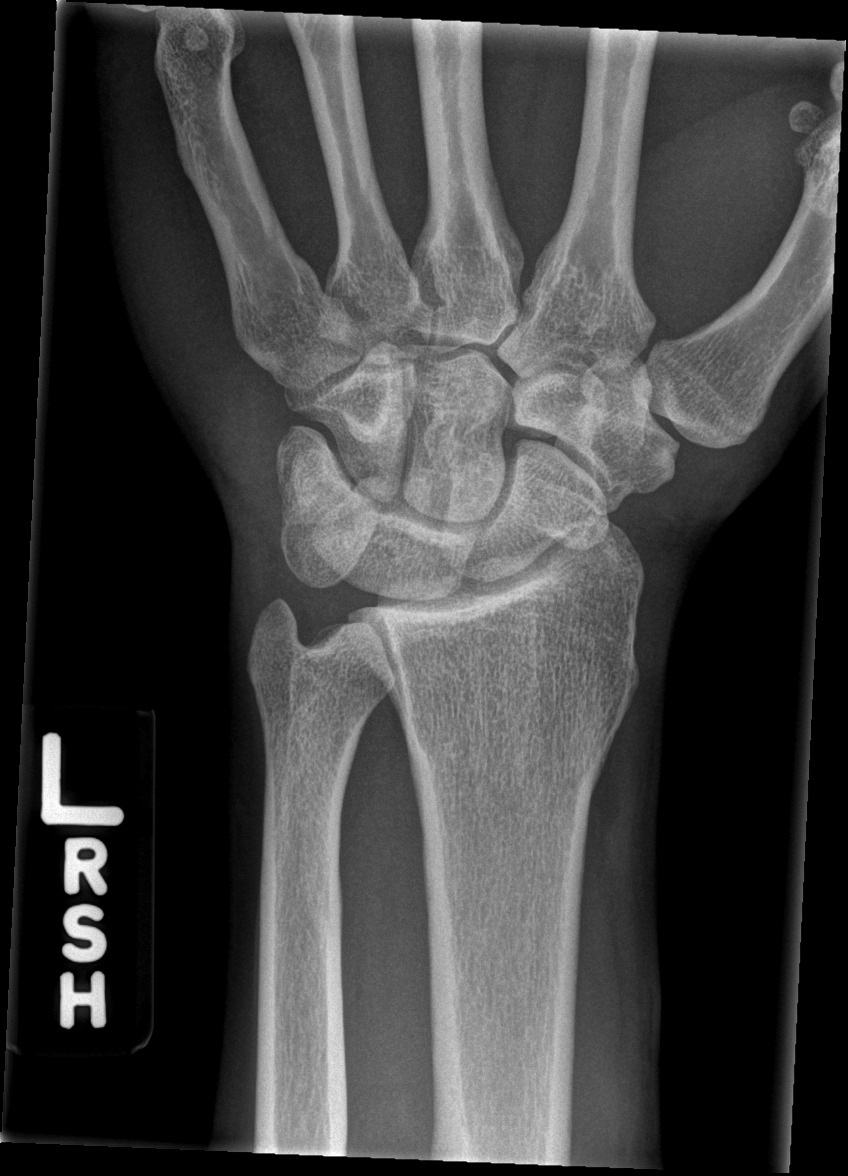

[x wrist obl left]
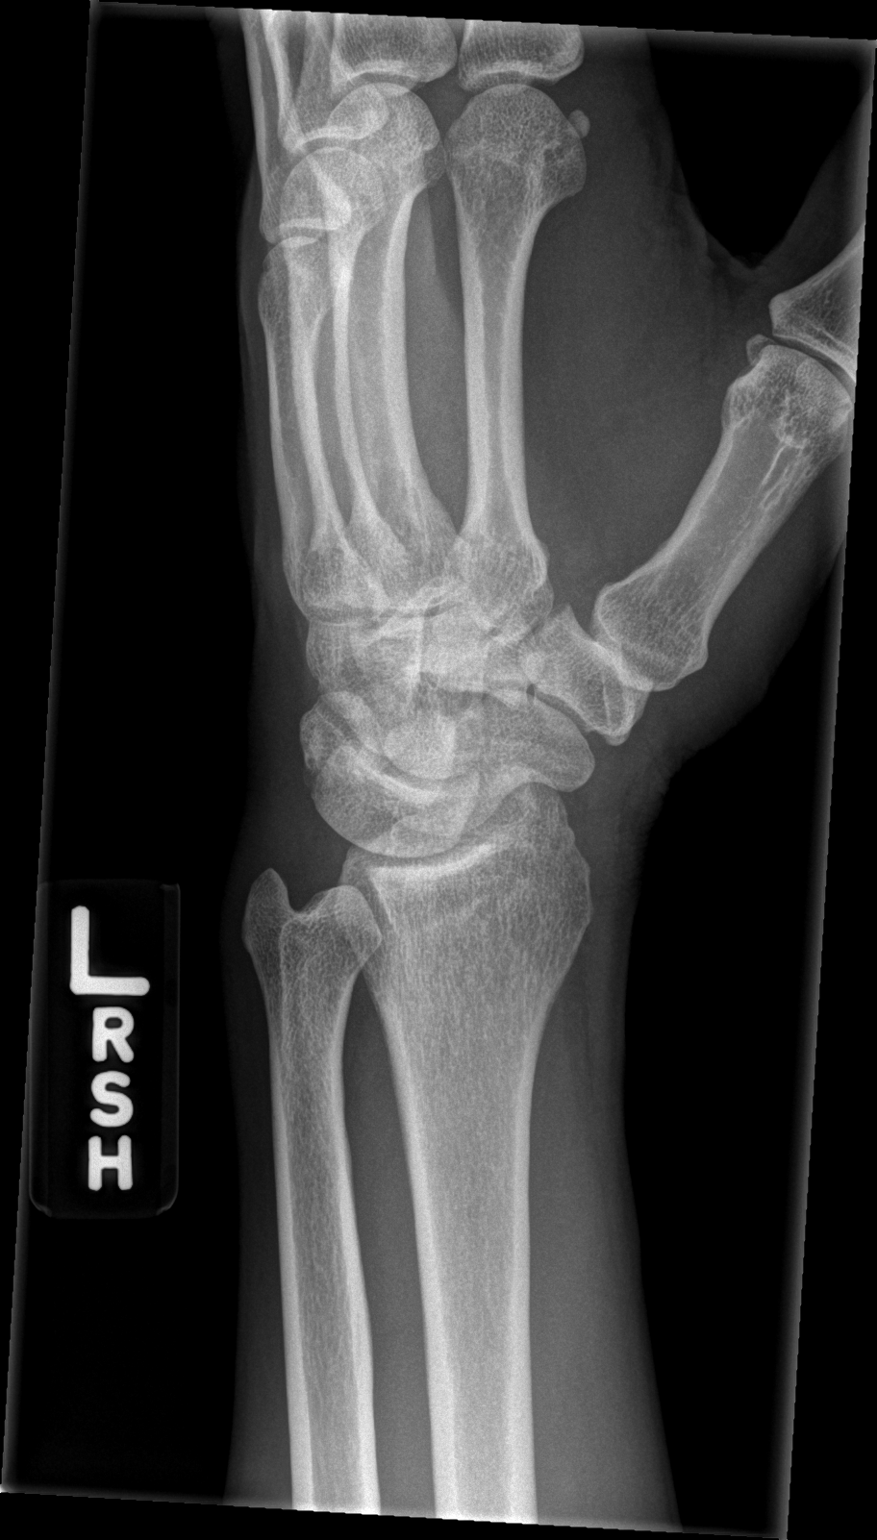

[x wrist lat left]
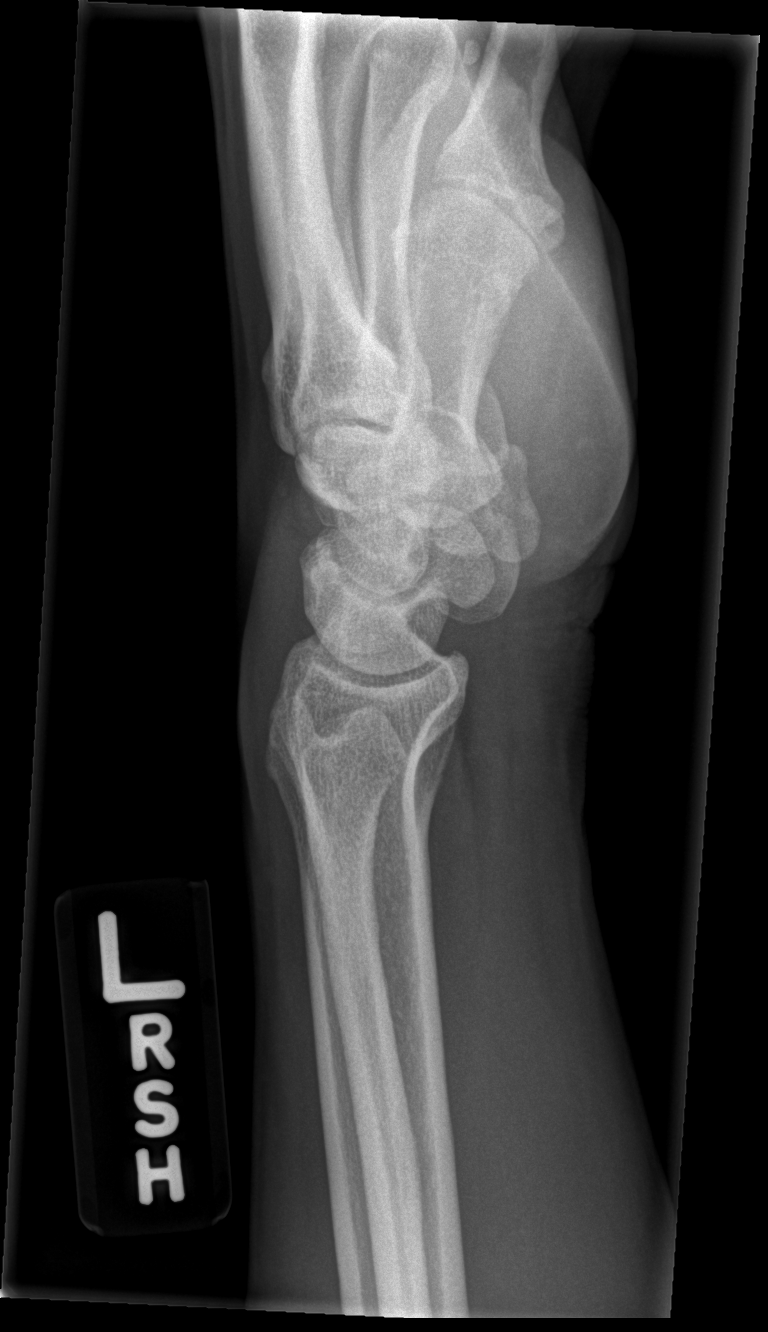

[x wrist navicular view left]
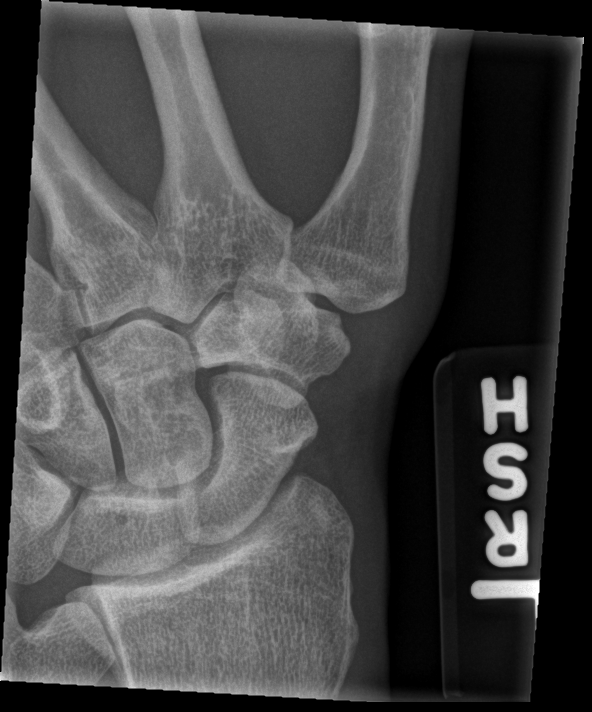

[4 of 4 positions shown; findings below may reference images not displayed]

FINDINGS: There is no evidence of fracture or dislocation. There is no
evidence of arthropathy or other focal bone abnormality. Soft
tissues are unremarkable.
IMPRESSION: Negative.

## 2020-05-02 ENCOUNTER — Other Ambulatory Visit: Payer: Self-pay

## 2020-05-02 ENCOUNTER — Ambulatory Visit (HOSPITAL_COMMUNITY)
Admission: EM | Admit: 2020-05-02 | Discharge: 2020-05-02 | Disposition: A | Discharge status: Home / Self Care | Payer: Federal, State, Local not specified - PPO | Attending: Emergency Medicine | Admitting: Emergency Medicine

## 2020-05-02 ENCOUNTER — Encounter (HOSPITAL_COMMUNITY): Payer: Self-pay | Admitting: Emergency Medicine

## 2020-05-02 DIAGNOSIS — Z791 Long term (current) use of non-steroidal anti-inflammatories (NSAID): Secondary | ICD-10-CM | POA: Insufficient documentation

## 2020-05-02 DIAGNOSIS — Z7952 Long term (current) use of systemic steroids: Secondary | ICD-10-CM | POA: Diagnosis not present

## 2020-05-02 DIAGNOSIS — Z20822 Contact with and (suspected) exposure to covid-19: Secondary | ICD-10-CM | POA: Insufficient documentation

## 2020-05-02 DIAGNOSIS — R509 Fever, unspecified: Secondary | ICD-10-CM | POA: Diagnosis not present

## 2020-05-02 DIAGNOSIS — F17210 Nicotine dependence, cigarettes, uncomplicated: Secondary | ICD-10-CM | POA: Diagnosis not present

## 2020-05-02 DIAGNOSIS — R110 Nausea: Secondary | ICD-10-CM | POA: Diagnosis not present

## 2020-05-02 DIAGNOSIS — B349 Viral infection, unspecified: Secondary | ICD-10-CM | POA: Diagnosis not present

## 2020-05-02 LAB — SARS CORONAVIRUS 2 (TAT 6-24 HRS): SARS Coronavirus 2: NEGATIVE

## 2020-05-02 LAB — CBG MONITORING, ED: Glucose-Capillary: 97 mg/dL (ref 70–99)

## 2020-05-02 MED ORDER — ONDANSETRON 4 MG PO TBDP: 4.0000 mg | ORAL_TABLET | Freq: Three times a day (TID) | ORAL | 0 refills | Status: AC | PRN

## 2020-05-02 NOTE — ED Provider Notes (Signed)
[facility]    CSN: [id number] Arrival date & time: 05/02/20  1104      History   Chief Complaint Chief Complaint  Patient presents with  . Nausea  . Fever    HPI Eduardo Coffey is a 55 y.o. male.   Patient presents with 1 day history of fever and nausea.  T-max 99.4 yesterday.  He denies fever or nausea today.  He denies vomiting, diarrhea, constipation, rash, chest pain, shortness of breath, or other symptoms.  No treatments attempted at home.  Patient attributes his symptoms to low blood sugar.  He has not had anything to eat or drink today he states.  No history of diabetes.  His medical history includes sarcoidosis.  The history is provided by the patient and medical records.    Past Medical History:  Diagnosis Date  . Back strain   . Sarcoidosis     There are no problems to display for this patient.   History reviewed. No pertinent surgical history.     Home Medications    Prior to Admission medications   Medication Sig Start Date End Date Taking? Authorizing Provider  ondansetron (ZOFRAN ODT) 4 MG disintegrating tablet Take 1 tablet (4 mg total) by mouth every 8 (eight) hours as needed for nausea or vomiting. 05/02/20  Yes Mickie Bail, NP  acetaminophen (TYLENOL) 500 MG tablet Take 1 tablet (500 mg total) by mouth every 6 (six) hours as needed. 05/03/16   Everlene Farrier, PA-C  benzonatate (TESSALON) 100 MG capsule Take 1 capsule (100 mg total) by mouth 3 (three) times daily as needed. 05/03/16   Everlene Farrier, PA-C  cetirizine (ZYRTEC ALLERGY) 10 MG tablet Take 1 tablet (10 mg total) by mouth daily. 05/03/16   Everlene Farrier, PA-C  fluticasone (FLONASE) 50 MCG/ACT nasal spray Place 2 sprays into both nostrils daily. 05/03/16   Everlene Farrier, PA-C  ipratropium (ATROVENT) 0.06 % nasal spray Place 2 sprays into both nostrils 4 (four) times daily. 08/06/14   Rodolph Bong, MD  naproxen (EC-NAPROSYN) 500 MG EC tablet Take 1 tablet (500 mg total) by mouth 2 (two)  times daily with a meal. 03/17/12   Hayden Rasmussen, NP  oxymetazoline (AFRIN NASAL SPRAY) 0.05 % nasal spray Place 1 spray into both nostrils 2 (two) times daily. Spray once into each nostril twice daily for up to the next 3 days. Do not use for more than 3 days to prevent rebound rhinorrhea. 04/17/16   Barrett Henle, PA-C  predniSONE (STERAPRED UNI-PAK 21 TAB) 10 MG (21) TBPK tablet Take 6 tabs daily x2 days, then 5 tabs x2 days, then 4 tabs x2 days, then 3 tabs x2 days, 2 tabs x2 days, then 1 tab x2 days 09/28/17   McDonald, Mia A, PA-C  promethazine-codeine (PHENERGAN WITH CODEINE) 6.25-10 MG/5ML syrup Take 5 mLs by mouth every 6 (six) hours as needed for cough. 08/06/14   Rodolph Bong, MD    Family History History reviewed. No pertinent family history.  Social History Social History   Tobacco Use  . Smoking status: Current Every Day Smoker    Packs/day: 1.00    Types: Cigarettes  . Smokeless tobacco: Never Used  Substance Use Topics  . Alcohol use: Yes    Alcohol/week: 24.0 standard drinks    Types: 24 Cans of beer per week  . Drug use: No     Allergies   Patient has no known allergies.   Review of Systems Review  of Systems  Constitutional: Positive for fever. Negative for chills.  HENT: Negative for ear pain and sore throat.   Eyes: Negative for pain and visual disturbance.  Respiratory: Negative for cough and shortness of breath.   Cardiovascular: Negative for chest pain and palpitations.  Gastrointestinal: Positive for nausea. Negative for abdominal pain, constipation, diarrhea and vomiting.  Genitourinary: Negative for dysuria and hematuria.  Musculoskeletal: Negative for arthralgias and back pain.  Skin: Negative for color change and rash.  Neurological: Negative for seizures and syncope.  All other systems reviewed and are negative.    Physical Exam Triage Vital Signs ED Triage Vitals  Enc Vitals Group     BP 05/02/20 1148 106/69     Pulse Rate 05/02/20  1148 80     Resp 05/02/20 1148 18     Temp 05/02/20 1148 98.4 F (36.9 C)     Temp Source 05/02/20 1148 Oral     SpO2 05/02/20 1148 100 %     Weight --      Height --      Head Circumference --      Peak Flow --      Pain Score 05/02/20 1147 0     Pain Loc --      Pain Edu? --      Excl. in GC? --    No data found.  Updated Vital Signs BP 106/69 (BP Location: Right Arm)   Pulse 80   Temp 98.4 F (36.9 C) (Oral)   Resp 18   SpO2 100%   Visual Acuity Right Eye Distance:   Left Eye Distance:   Bilateral Distance:    Right Eye Near:   Left Eye Near:    Bilateral Near:     Physical Exam Vitals and nursing note reviewed.  Constitutional:      General: He is not in acute distress.    Appearance: He is well-developed and well-nourished. He is not ill-appearing.  HENT:     Head: Normocephalic and atraumatic.     Right Ear: Tympanic membrane normal.     Left Ear: Tympanic membrane normal.     Nose: Nose normal.     Mouth/Throat:     Mouth: Mucous membranes are moist.     Pharynx: Oropharynx is clear.  Eyes:     Conjunctiva/sclera: Conjunctivae normal.  Cardiovascular:     Rate and Rhythm: Normal rate and regular rhythm.     Heart sounds: Normal heart sounds.  Pulmonary:     Effort: Pulmonary effort is normal. No respiratory distress.     Breath sounds: Normal breath sounds.  Abdominal:     General: Bowel sounds are normal. There is no distension.     Palpations: Abdomen is soft.     Tenderness: There is no abdominal tenderness. There is no guarding or rebound.  Musculoskeletal:        General: No edema.     Cervical back: Neck supple.  Skin:    General: Skin is warm and dry.     Findings: No rash.  Neurological:     General: No focal deficit present.     Mental Status: He is alert and oriented to person, place, and time.     Gait: Gait normal.  Psychiatric:        Mood and Affect: Mood and affect and mood normal.        Behavior: Behavior normal.       UC Treatments / Results  Labs (all  labs ordered are listed, but only abnormal results are displayed) Labs Reviewed  SARS CORONAVIRUS 2 (TAT 6-24 HRS)  CBG MONITORING, ED    EKG   Radiology No results found.  Procedures Procedures (including critical care time)  Medications Ordered in UC Medications - No data to display  Initial Impression / Assessment and Plan / UC Course  I have reviewed the triage vital signs and the nursing notes.  Pertinent labs & imaging results that were available during my care of the patient were reviewed by me and considered in my medical decision making (see chart for details).   Viral illness.  Treating nausea with Zofran.  Instructed patient to stay hydrated with clear liquids.  COVID pending.  Instructed patient to self quarantine until the test results are back.  Discussed symptomatic treatment including Tylenol, rest, hydration.  Instructed patient to follow up with PCP if his symptoms are not improving.  Patient agrees to plan of care.    Final Clinical Impressions(s) / UC Diagnoses   Final diagnoses:  Viral illness     Discharge Instructions     Take the antinausea medication as directed.    Keep yourself hydrated with clear liquids, such as water, Gatorade, Pedialyte, Sprite, or ginger ale.    Your COVID test is pending.  You should self quarantine until the test result is back.    Take Tylenol or ibuprofen as needed for fever or discomfort.  Rest and keep yourself hydrated.    Follow-up with your primary care provider if your symptoms are not improving.           ED Prescriptions    Medication Sig Dispense Auth. Provider   ondansetron (ZOFRAN ODT) 4 MG disintegrating tablet Take 1 tablet (4 mg total) by mouth every 8 (eight) hours as needed for nausea or vomiting. 20 tablet Mickie Bail, NP     PDMP not reviewed this encounter.   Mickie Bail, NP 05/02/20 1244

## 2020-05-02 NOTE — Discharge Instructions (Signed)
Take the antinausea medication as directed.    Keep yourself hydrated with clear liquids, such as water, Gatorade, Pedialyte, Sprite, or ginger ale.    Your COVID test is pending.  You should self quarantine until the test result is back.    Take Tylenol or ibuprofen as needed for fever or discomfort.  Rest and keep yourself hydrated.    Follow-up with your primary care provider if your symptoms are not improving.

## 2020-05-02 NOTE — ED Triage Notes (Addendum)
Pt presents with fever and nausea. States developed symptoms at work yesterday.   States he thinks his sugar drops often when going long periods without eating.

## 2022-11-24 ENCOUNTER — Ambulatory Visit
Admission: EM | Admit: 2022-11-24 | Discharge: 2022-11-24 | Disposition: A | Discharge status: Home / Self Care | Payer: Federal, State, Local not specified - PPO | Attending: Physician Assistant | Admitting: Physician Assistant

## 2022-11-24 DIAGNOSIS — K219 Gastro-esophageal reflux disease without esophagitis: Secondary | ICD-10-CM

## 2022-11-24 LAB — POCT URINALYSIS DIP (MANUAL ENTRY)
Bilirubin, UA: NEGATIVE
Blood, UA: NEGATIVE
Glucose, UA: NEGATIVE mg/dL
Leukocytes, UA: NEGATIVE
Nitrite, UA: NEGATIVE
Spec Grav, UA: 1.025 (ref 1.010–1.025)
Urobilinogen, UA: 1 E.U./dL
pH, UA: 6 (ref 5.0–8.0)

## 2022-11-24 MED ORDER — ALUM & MAG HYDROXIDE-SIMETH 200-200-20 MG/5ML PO SUSP
30.0000 mL | Freq: Once | ORAL | Status: AC
  Administered 2022-11-24: 30 mL via ORAL

## 2022-11-24 MED ORDER — LIDOCAINE VISCOUS HCL 2 % MT SOLN
15.0000 mL | Freq: Once | OROMUCOSAL | Status: AC
  Administered 2022-11-24: 15 mL via OROMUCOSAL

## 2022-11-24 MED ORDER — OMEPRAZOLE 40 MG PO CPDR: 40.0000 mg | DELAYED_RELEASE_CAPSULE | Freq: Every day | ORAL | 0 refills | Status: AC

## 2022-11-24 NOTE — Discharge Instructions (Addendum)
Review food choices for GERD Begin taking omeprazole once daily Recommend follow up with a Primary Care Physician  Return if you develop new or worsening symptoms.

## 2022-11-24 NOTE — ED Provider Notes (Signed)
[facility]    CSN: [id number] Arrival date & time: 11/24/22  1445      History   Chief Complaint Chief Complaint  Patient presents with   Abdominal Pain    HPI Eduardo Coffey is a 57 y.o. male.   Patient complains of epigastric discomfort that started about 3 weeks ago.  He reports he has been dealing with some shoulder pain and has increased his Motrin and ibuprofen over the last few weeks.  He feels that this has caused his increased epigastric pain.  He also complains of reflux which has been worse over the last 3 weeks.  He denies lower abdominal pain, blood in stool or urine.  He has tried nothing for the symptoms.  He does report drinking several sodas daily.    Past Medical History:  Diagnosis Date   Back strain    Sarcoidosis     There are no problems to display for this patient.   History reviewed. No pertinent surgical history.     Home Medications    Prior to Admission medications   Medication Sig Start Date End Date Taking? Authorizing Provider  ibuprofen (ADVIL) 800 MG tablet Take 800 mg by mouth every 8 (eight) hours as needed for fever or moderate pain. Last dose: "2nd week of Sept. 2024"   Yes [provider]  omeprazole (PRILOSEC) 40 MG capsule Take 1 capsule (40 mg total) by mouth daily. 11/24/22 12/24/22 Yes Ward, Tylene Fantasia, PA-C  acetaminophen (TYLENOL) 500 MG tablet Take 1 tablet (500 mg total) by mouth every 6 (six) hours as needed. 05/03/16   Everlene Farrier, PA-C  benzonatate (TESSALON) 100 MG capsule Take 1 capsule (100 mg total) by mouth 3 (three) times daily as needed. 05/03/16   Everlene Farrier, PA-C  cetirizine (ZYRTEC ALLERGY) 10 MG tablet Take 1 tablet (10 mg total) by mouth daily. 05/03/16   Everlene Farrier, PA-C  fluticasone (FLONASE) 50 MCG/ACT nasal spray Place 2 sprays into both nostrils daily. 05/03/16   Everlene Farrier, PA-C  ipratropium (ATROVENT) 0.06 % nasal spray Place 2 sprays into both nostrils 4 (four) times daily.  08/06/14   Rodolph Bong, MD  naproxen (EC-NAPROSYN) 500 MG EC tablet Take 1 tablet (500 mg total) by mouth 2 (two) times daily with a meal. 03/17/12   Mabe, Onalee Hua, NP  ondansetron (ZOFRAN ODT) 4 MG disintegrating tablet Take 1 tablet (4 mg total) by mouth every 8 (eight) hours as needed for nausea or vomiting. 05/02/20   Mickie Bail, NP  oxymetazoline (AFRIN NASAL SPRAY) 0.05 % nasal spray Place 1 spray into both nostrils 2 (two) times daily. Spray once into each nostril twice daily for up to the next 3 days. Do not use for more than 3 days to prevent rebound rhinorrhea. 04/17/16   Barrett Henle, PA-C  predniSONE (STERAPRED UNI-PAK 21 TAB) 10 MG (21) TBPK tablet Take 6 tabs daily x2 days, then 5 tabs x2 days, then 4 tabs x2 days, then 3 tabs x2 days, 2 tabs x2 days, then 1 tab x2 days 09/28/17   McDonald, Mia A, PA-C  promethazine-codeine (PHENERGAN WITH CODEINE) 6.25-10 MG/5ML syrup Take 5 mLs by mouth every 6 (six) hours as needed for cough. 08/06/14   Rodolph Bong, MD    Family History History reviewed. No pertinent family history.  Social History Social History   Tobacco Use   Smoking status: Every Day    Current packs/day: 1.00    Types: Cigarettes  Smokeless tobacco: Never  Vaping Use   Vaping status: Never Used  Substance Use Topics   Alcohol use: Yes    Alcohol/week: 1.0 standard drink of alcohol    Types: 1 Cans of beer per week    Comment: Daily   Drug use: No     Allergies   Patient has no known allergies.   Review of Systems Review of Systems  Constitutional:  Negative for chills and fever.  HENT:  Negative for ear pain and sore throat.   Eyes:  Negative for pain and visual disturbance.  Respiratory:  Negative for cough and shortness of breath.   Cardiovascular:  Negative for chest pain and palpitations.  Gastrointestinal:  Positive for abdominal pain. Negative for blood in stool and vomiting.  Genitourinary:  Negative for dysuria and hematuria.   Musculoskeletal:  Negative for arthralgias and back pain.  Skin:  Negative for color change and rash.  Neurological:  Negative for seizures and syncope.  All other systems reviewed and are negative.    Physical Exam Triage Vital Signs ED Triage Vitals  Encounter Vitals Group     BP 11/24/22 1542 121/76     Systolic BP Percentile --      Diastolic BP Percentile --      Pulse Rate 11/24/22 1542 85     Resp 11/24/22 1542 16     Temp 11/24/22 1542 99 F (37.2 C)     Temp Source 11/24/22 1542 Oral     SpO2 11/24/22 1542 99 %     Weight 11/24/22 1540 184 lb 15.5 oz (83.9 kg)     Height 11/24/22 1540 6\' 1"  (1.854 m)     Head Circumference --      Peak Flow --      Pain Score 11/24/22 1536 0     Pain Loc --      Pain Education --      Exclude from Growth Chart --    No data found.  Updated Vital Signs BP 121/76 (BP Location: Left Arm)   Pulse 85   Temp 99 F (37.2 C) (Oral)   Resp 16   Ht 6\' 1"  (1.854 m)   Wt 184 lb 15.5 oz (83.9 kg)   SpO2 99%   BMI 24.40 kg/m   Visual Acuity Right Eye Distance:   Left Eye Distance:   Bilateral Distance:    Right Eye Near:   Left Eye Near:    Bilateral Near:     Physical Exam Vitals and nursing note reviewed.  Constitutional:      General: He is not in acute distress.    Appearance: He is well-developed.  HENT:     Head: Normocephalic and atraumatic.  Eyes:     Conjunctiva/sclera: Conjunctivae normal.  Cardiovascular:     Rate and Rhythm: Normal rate and regular rhythm.     Heart sounds: No murmur heard. Pulmonary:     Effort: Pulmonary effort is normal. No respiratory distress.     Breath sounds: Normal breath sounds.  Abdominal:     Palpations: Abdomen is soft.     Tenderness: There is no abdominal tenderness.  Musculoskeletal:        General: No swelling.     Cervical back: Neck supple.  Skin:    General: Skin is warm and dry.     Capillary Refill: Capillary refill takes less than 2 seconds.  Neurological:      Mental Status: He is alert.  Psychiatric:  Mood and Affect: Mood normal.      UC Treatments / Results  Labs (all labs ordered are listed, but only abnormal results are displayed) Labs Reviewed  POCT URINALYSIS DIP (MANUAL ENTRY) - Abnormal; Notable for the following components:      Result Value   Ketones, POC UA trace (5) (*)    Protein Ur, POC trace (*)    All other components within normal limits    EKG   Radiology No results found.  Procedures Procedures (including critical care time)  Medications Ordered in UC Medications  alum & mag hydroxide-simeth (MAALOX/MYLANTA) 200-200-20 MG/5ML suspension 30 mL (30 mLs Oral Given 11/24/22 1611)  lidocaine (XYLOCAINE) 2 % viscous mouth solution 15 mL (15 mLs Mouth/Throat Given 11/24/22 1611)    Initial Impression / Assessment and Plan / UC Course  I have reviewed the triage vital signs and the nursing notes.  Pertinent labs & imaging results that were available during my care of the patient were reviewed by me and considered in my medical decision making (see chart for details).     GERD.  GI cocktail in clinic today which provided some relief of epigastric discomfort.  Omeprazole prescribed.  Discussed food choices.  ED precautions given. Final Clinical Impressions(s) / UC Diagnoses   Final diagnoses:  Gastroesophageal reflux disease without esophagitis     Discharge Instructions      Review food choices for GERD Begin taking omeprazole once daily Recommend follow up with a Primary Care Physician  Return if you develop new or worsening symptoms.    ED Prescriptions     Medication Sig Dispense Auth. Provider   omeprazole (PRILOSEC) 40 MG capsule Take 1 capsule (40 mg total) by mouth daily. 30 capsule Ward, Tylene Fantasia, PA-C      PDMP not reviewed this encounter.   Ward, Tylene Fantasia, PA-C 11/24/22 1626

## 2022-11-24 NOTE — ED Triage Notes (Signed)
"  This abd pain started about 3 wks ago, I took some Motrin for shoulder pain then and it tore me up causing abd pain/ache with nausea". "I think I may have an Uler". No dysuria. No urinary frequency or urgency. Stools "loose" (most recent), No blood seen in stool or urine.
